# Patient Record
Sex: Female | Born: 1995
Health system: Southern US, Community
[De-identification: ages and names within clinical notes are randomized; demographics above are authoritative.]

## PROBLEM LIST (undated history)

## (undated) DIAGNOSIS — K589 Irritable bowel syndrome without diarrhea: Secondary | ICD-10-CM

## (undated) DIAGNOSIS — F32A Depression, unspecified: Secondary | ICD-10-CM

## (undated) DIAGNOSIS — F419 Anxiety disorder, unspecified: Secondary | ICD-10-CM

## (undated) DIAGNOSIS — F329 Major depressive disorder, single episode, unspecified: Secondary | ICD-10-CM

---

## 2016-08-11 ENCOUNTER — Emergency Department (HOSPITAL_COMMUNITY)
Admission: EM | Admit: 2016-08-11 | Discharge: 2016-08-11 | Disposition: A | Discharge status: Home / Self Care | Payer: Medicaid Other | Attending: Emergency Medicine | Admitting: Emergency Medicine

## 2016-08-11 ENCOUNTER — Encounter (HOSPITAL_COMMUNITY): Payer: Self-pay | Admitting: *Deleted

## 2016-08-11 DIAGNOSIS — R0789 Other chest pain: Secondary | ICD-10-CM | POA: Diagnosis not present

## 2016-08-11 DIAGNOSIS — R05 Cough: Secondary | ICD-10-CM | POA: Diagnosis present

## 2016-08-11 MED ORDER — PREDNISONE 10 MG PO TABS: 20.0000 mg | ORAL_TABLET | Freq: Every day | ORAL | 0 refills | Status: DC

## 2016-08-11 NOTE — ED Triage Notes (Signed)
Pt complains of worsening cough for the past 4 weeks. Pt states today she noticed a lump on her upper left abdomen that is painful when she coughs.

## 2016-08-11 NOTE — ED Provider Notes (Signed)
[facility] DEPT Provider Note   CSN: [id number] Arrival date & time: 08/11/16  1547     History   Chief Complaint Chief Complaint  Patient presents with  . Cough  . Abdominal Pain    HPI Madison Molina is a 20 y.o. female.  20 year old female presents with 4 week history of cough. [facility] and prescribed doxycycline as well as benzoate Perles. She had chest x-ray at that time as well which the results are pending. Cough is been nonproductive. There has been no blood associated with it. She denies any recent travel history of possible TB exposure. Denies any exposure to toxic fumes. Does admit to marijuana use. Denies any daily tobacco use. No recent weight loss. No night sweats. Cough seems to be worse at night and not associated with fever or chills. No vomiting or diarrhea noted. Patient is currently taking her current medications as prescribed. Patient developed a tender area at her left anterior rib cage characterized as sharp and worse with coughing as well as certain positions better with remaining still. Denies any worsening dyspnea.      History reviewed. No pertinent past medical history.  There are no active problems to display for this patient.   History reviewed. No pertinent surgical history.  OB History    No data available       Home Medications    Prior to Admission medications   Not on File    Family History No family history on file.  Social History Social History  Substance Use Topics  . Smoking status: Never Smoker  . Smokeless tobacco: Never Used  . Alcohol use No     Allergies   Review of patient's allergies indicates not on file.   Review of Systems Review of Systems  All other systems reviewed and are negative.    Physical Exam Updated Vital Signs BP 110/75 (BP Location: Left Arm)   Pulse 107   Temp 98.5 F (36.9 C) (Oral)   Resp 18   LMP 08/04/2016   SpO2 96%   Physical Exam    Constitutional: She is oriented to person, place, and time. She appears well-developed and well-nourished.  Non-toxic appearance. No distress.  HENT:  Head: Normocephalic and atraumatic.  Eyes: Conjunctivae, EOM and lids are normal. Pupils are equal, round, and reactive to light.  Neck: Normal range of motion. Neck supple. No tracheal deviation present. No thyroid mass present.  Cardiovascular: Normal rate, regular rhythm and normal heart sounds.  Exam reveals no gallop.   No murmur heard. Pulmonary/Chest: Effort normal and breath sounds normal. No stridor. No respiratory distress. She has no decreased breath sounds. She has no wheezes. She has no rhonchi. She has no rales. She exhibits tenderness and bony tenderness. She exhibits no crepitus.    Abdominal: Soft. Normal appearance and bowel sounds are normal. She exhibits no distension. There is no tenderness. There is no rebound and no CVA tenderness.  Musculoskeletal: Normal range of motion. She exhibits no edema or tenderness.  Neurological: She is alert and oriented to person, place, and time. She has normal strength. No cranial nerve deficit or sensory deficit. GCS eye subscore is 4. GCS verbal subscore is 5. GCS motor subscore is 6.  Skin: Skin is warm and dry. No abrasion and no rash noted.  Psychiatric: She has a normal mood and affect. Her speech is normal and behavior is normal.  Nursing note and vitals reviewed.    ED Treatments /  Results  Labs (all labs ordered are listed, but only abnormal results are displayed) Labs Reviewed - No data to display  EKG  EKG Interpretation None       Radiology No results found.  Procedures Procedures (including critical care time)  Medications Ordered in ED Medications - No data to display   Initial Impression / Assessment and Plan / ED Course  I have reviewed the triage vital signs and the nursing notes.  Pertinent labs & imaging results that were available during my care of  the patient were reviewed by me and considered in my medical decision making (see chart for details).  Clinical Course    Patient with chest wall pain. Offered x-ray which she is deferred. Pulse ox is stable. Suspect muscular skeletal etiology of her symptoms is stable for discharge with return precautions  Final Clinical Impressions(s) / ED Diagnoses   Final diagnoses:  None    New Prescriptions New Prescriptions   No medications on file     Lorre Nick, MD 08/11/16 1644

## 2016-12-03 ENCOUNTER — Encounter (HOSPITAL_COMMUNITY): Payer: Self-pay | Admitting: Emergency Medicine

## 2016-12-03 ENCOUNTER — Emergency Department (HOSPITAL_COMMUNITY)
Admission: EM | Admit: 2016-12-03 | Discharge: 2016-12-03 | Disposition: A | Discharge status: Home / Self Care | Payer: Medicaid Other | Attending: Emergency Medicine | Admitting: Emergency Medicine

## 2016-12-03 DIAGNOSIS — M791 Myalgia: Secondary | ICD-10-CM | POA: Diagnosis not present

## 2016-12-03 DIAGNOSIS — R6889 Other general symptoms and signs: Secondary | ICD-10-CM

## 2016-12-03 DIAGNOSIS — J029 Acute pharyngitis, unspecified: Secondary | ICD-10-CM | POA: Diagnosis present

## 2016-12-03 HISTORY — DX: Irritable bowel syndrome, unspecified: K58.9

## 2016-12-03 MED ORDER — ONDANSETRON 4 MG PO TBDP: 4.0000 mg | ORAL_TABLET | Freq: Three times a day (TID) | ORAL | 0 refills | Status: DC | PRN

## 2016-12-03 NOTE — ED Provider Notes (Signed)
[facility] Provider Note   CSN: [id number] Arrival date & time: 12/03/16  1016  By signing my name below, I, Modena JanskyAlbert Thayil, attest that this documentation has been prepared under the direction and in the presence of non-physician practitioner, Terance HartKelly Robbyn Hodkinson, PA-C. Electronically Signed: Modena JanskyAlbert Thayil, Scribe. 12/03/2016. 11:35 AM.  History   Chief Complaint Chief Complaint  Patient presents with  . Sore Throat  . Generalized Body Aches  . Chills   The history is provided by the patient. No language interpreter was used.   HPI Comments: Madison Molina Age is a 21 y.o. female with a PMHx of IBS who presents to the Emergency Department complaining of constant moderate sore throat that started about 4 days ago. She states she has been having gradually worsening URI-like symptoms. She has been taking Sudafed without any relief. She reports associated diaphoresis (at night), fever (subjective), chills, nasal congestion, rhinorrhea, generalized myalgias, sore throat, sneezing, coughing (productive), SOB, nausea, and headache (generalized). She admits to sick contacts. Pt's temperature in the ED today was 99.5. She denies any influenza vaccination this year, abdominal pain, vomiting, diarrhea, or other complaints.   Past Medical History:  Diagnosis Date  . IBS (irritable bowel syndrome)     There are no active problems to display for this patient.   History reviewed. No pertinent surgical history.  OB History    No data available       Home Medications    Prior to Admission medications   Medication Sig Start Date End Date Taking? Authorizing Provider  ondansetron (ZOFRAN ODT) 4 MG disintegrating tablet Take 1 tablet (4 mg total) by mouth every 8 (eight) hours as needed for nausea or vomiting. 12/03/16   Bethel BornKelly Marie Dalbert Stillings, PA-C  predniSONE (DELTASONE) 10 MG tablet Take 2 tablets (20 mg total) by mouth daily. 08/11/16   Lorre NickAnthony Allen, MD    Family History No family history on  file.  Social History Social History  Substance Use Topics  . Smoking status: Never Smoker  . Smokeless tobacco: Never Used  . Alcohol use No     Allergies   Patient has no known allergies.   Review of Systems Review of Systems  Constitutional: Positive for chills, diaphoresis and fever (Subjective).  HENT: Positive for congestion (Nasal), rhinorrhea, sneezing and sore throat.   Respiratory: Positive for cough and shortness of breath.   Gastrointestinal: Positive for nausea. Negative for abdominal pain, diarrhea and vomiting.  Musculoskeletal: Positive for myalgias (Generalized).  Neurological: Positive for headaches.     Physical Exam Updated Vital Signs BP 121/77 (BP Location: Right Arm)   Pulse 108   Temp 99.5 F (37.5 C) (Oral)   Resp 18   Ht 5\' 7"  (1.702 m)   Wt 107 lb (48.5 kg)   SpO2 100%   BMI 16.76 kg/m   Physical Exam  Constitutional: She appears well-developed and well-nourished. No distress.  Well-appearing.   HENT:  Head: Normocephalic.  Nose: Mucosal edema and rhinorrhea present.  Mouth/Throat: Mucous membranes are normal. Mucous membranes are not dry.  Eyes: Conjunctivae are normal.  Neck: Neck supple.  Cardiovascular: Regular rhythm.   Tachycardic.   Pulmonary/Chest: Effort normal.  Abdominal: Soft.  Musculoskeletal: Normal range of motion.  Neurological: She is alert.  Skin: Skin is warm and dry.  Psychiatric: She has a normal mood and affect.  Nursing note and vitals reviewed.    ED Treatments / Results  DIAGNOSTIC STUDIES: Oxygen Saturation is 100% on RA, normal by my interpretation.  COORDINATION OF CARE: 11:39 AM- Pt advised of plan for treatment and pt agrees.  Labs (all labs ordered are listed, but only abnormal results are displayed) Labs Reviewed - No data to display  EKG  EKG Interpretation None       Radiology No results found.  Procedures Procedures (including critical care time)  Medications Ordered in  ED Medications - No data to display   Initial Impression / Assessment and Plan / ED Course  I have reviewed the triage vital signs and the nursing notes.  Pertinent labs & imaging results that were available during my care of the patient were reviewed by me and considered in my medical decision making (see chart for details).  21 year old female presents with flu-like symptoms. Discussed that antibiotics are not indicated for viral infections. She is out of the window for tx with Tamiflu. Pt will be discharged with symptomatic treatment.  Verbalizes understanding and is agreeable with plan. Pt is hemodynamically stable & in NAD prior to dc.    Final Clinical Impressions(s) / ED Diagnoses   Final diagnoses:  Flu-like symptoms    New Prescriptions New Prescriptions   ONDANSETRON (ZOFRAN ODT) 4 MG DISINTEGRATING TABLET    Take 1 tablet (4 mg total) by mouth every 8 (eight) hours as needed for nausea or vomiting.   I personally performed the services described in this documentation, which was scribed in my presence. The recorded information has been reviewed and is accurate.     Bethel Born, PA-C 12/05/16 1639    Cathren Laine, MD 12/05/16 [phone]

## 2016-12-03 NOTE — Discharge Instructions (Signed)
Please rest and drink plenty of fluids Take Tylenol and Ibuprofen for pain/fever Take Zofran for nausea

## 2016-12-03 NOTE — ED Triage Notes (Signed)
Patient c/o sore throat, body aches, chills and sweats since Thursday.  Patient been taking Sudafed for her symptoms without any relief.  Patient reports that she has been sweating in her sleep.

## 2016-12-03 NOTE — ED Notes (Signed)
Bed: WTR8 Expected date:  Expected time:  Means of arrival:  Comments: No chair

## 2017-09-02 DIAGNOSIS — B081 Molluscum contagiosum: Secondary | ICD-10-CM | POA: Diagnosis not present

## 2017-09-02 DIAGNOSIS — Z113 Encounter for screening for infections with a predominantly sexual mode of transmission: Secondary | ICD-10-CM | POA: Diagnosis not present

## 2017-10-23 DIAGNOSIS — R5383 Other fatigue: Secondary | ICD-10-CM | POA: Diagnosis not present

## 2017-10-23 DIAGNOSIS — Z8719 Personal history of other diseases of the digestive system: Secondary | ICD-10-CM | POA: Diagnosis not present

## 2017-10-23 DIAGNOSIS — R110 Nausea: Secondary | ICD-10-CM | POA: Diagnosis not present

## 2017-10-23 DIAGNOSIS — F419 Anxiety disorder, unspecified: Secondary | ICD-10-CM | POA: Diagnosis not present

## 2017-11-01 DIAGNOSIS — R197 Diarrhea, unspecified: Secondary | ICD-10-CM | POA: Diagnosis not present

## 2017-11-01 DIAGNOSIS — R6881 Early satiety: Secondary | ICD-10-CM | POA: Diagnosis not present

## 2017-11-01 DIAGNOSIS — K580 Irritable bowel syndrome with diarrhea: Secondary | ICD-10-CM | POA: Diagnosis not present

## 2017-11-01 DIAGNOSIS — R634 Abnormal weight loss: Secondary | ICD-10-CM | POA: Diagnosis not present

## 2017-11-15 DIAGNOSIS — R197 Diarrhea, unspecified: Secondary | ICD-10-CM | POA: Diagnosis not present

## 2017-11-15 DIAGNOSIS — R634 Abnormal weight loss: Secondary | ICD-10-CM | POA: Diagnosis not present

## 2017-11-15 DIAGNOSIS — B9681 Helicobacter pylori [H. pylori] as the cause of diseases classified elsewhere: Secondary | ICD-10-CM | POA: Diagnosis not present

## 2017-11-15 DIAGNOSIS — K2930 Chronic superficial gastritis without bleeding: Secondary | ICD-10-CM | POA: Diagnosis not present

## 2017-11-20 DIAGNOSIS — B9681 Helicobacter pylori [H. pylori] as the cause of diseases classified elsewhere: Secondary | ICD-10-CM | POA: Diagnosis not present

## 2017-11-20 DIAGNOSIS — K2930 Chronic superficial gastritis without bleeding: Secondary | ICD-10-CM | POA: Diagnosis not present

## 2017-12-17 ENCOUNTER — Emergency Department (HOSPITAL_COMMUNITY)
Admission: EM | Admit: 2017-12-17 | Discharge: 2017-12-18 | Disposition: A | Discharge status: Home / Self Care | Payer: BLUE CROSS/BLUE SHIELD | Attending: Emergency Medicine | Admitting: Emergency Medicine

## 2017-12-17 ENCOUNTER — Other Ambulatory Visit: Payer: Self-pay

## 2017-12-17 ENCOUNTER — Encounter (HOSPITAL_COMMUNITY): Payer: Self-pay | Admitting: *Deleted

## 2017-12-17 DIAGNOSIS — Z79899 Other long term (current) drug therapy: Secondary | ICD-10-CM | POA: Insufficient documentation

## 2017-12-17 DIAGNOSIS — F329 Major depressive disorder, single episode, unspecified: Secondary | ICD-10-CM | POA: Diagnosis not present

## 2017-12-17 DIAGNOSIS — R45851 Suicidal ideations: Secondary | ICD-10-CM

## 2017-12-17 DIAGNOSIS — R9431 Abnormal electrocardiogram [ECG] [EKG]: Secondary | ICD-10-CM | POA: Diagnosis not present

## 2017-12-17 HISTORY — DX: Depression, unspecified: F32.A

## 2017-12-17 HISTORY — DX: Major depressive disorder, single episode, unspecified: F32.9

## 2017-12-17 HISTORY — DX: Anxiety disorder, unspecified: F41.9

## 2017-12-17 LAB — ETHANOL: Alcohol, Ethyl (B): <10 mg/dL (ref <10)

## 2017-12-17 LAB — COMPREHENSIVE METABOLIC PANEL
ALK PHOS: 52 U/L (ref 38–126)
ALT: 12 U/L — ABNORMAL LOW (ref 14–54)
ANION GAP: 11 (ref 5–15)
AST: 22 U/L (ref 15–41)
Albumin: 5.2 g/dL — ABNORMAL HIGH (ref 3.5–5.0)
BUN: 17 mg/dL (ref 6–20)
CALCIUM: 9.9 mg/dL (ref 8.9–10.3)
CHLORIDE: 106 mmol/L (ref 101–111)
CO2: 23 mmol/L (ref 22–32)
Creatinine, Ser: 0.73 mg/dL (ref 0.44–1.00)
GFR calc non Af Amer: >60 mL/min (ref >60)
Glucose, Bld: 96 mg/dL (ref 65–99)
POTASSIUM: 3.5 mmol/L (ref 3.5–5.1)
SODIUM: 140 mmol/L (ref 135–145)
Total Bilirubin: 1.1 mg/dL (ref 0.3–1.2)
Total Protein: 8.9 g/dL — ABNORMAL HIGH (ref 6.5–8.1)

## 2017-12-17 LAB — CBC
HCT: 42.7 % (ref 36.0–46.0)
HEMOGLOBIN: 14 g/dL (ref 12.0–15.0)
MCH: 29.5 pg (ref 26.0–34.0)
MCHC: 32.8 g/dL (ref 30.0–36.0)
MCV: 90.1 fL (ref 78.0–100.0)
Platelets: 387 10*3/uL (ref 150–400)
RBC: 4.74 MIL/uL (ref 3.87–5.11)
RDW: 14 % (ref 11.5–15.5)
WBC: 4.9 10*3/uL (ref 4.0–10.5)

## 2017-12-17 LAB — SALICYLATE LEVEL

## 2017-12-17 LAB — I-STAT BETA HCG BLOOD, ED (MC, WL, AP ONLY)

## 2017-12-17 LAB — ACETAMINOPHEN LEVEL

## 2017-12-17 MED ORDER — AMOXICILLIN 500 MG PO CAPS
1000.0000 mg | ORAL_CAPSULE | Freq: Two times a day (BID) | ORAL | Status: DC
  Administered 2017-12-17: 1000 mg via ORAL
  Filled 2017-12-17: qty 2

## 2017-12-17 MED ORDER — FAMOTIDINE 20 MG PO TABS
20.0000 mg | ORAL_TABLET | Freq: Two times a day (BID) | ORAL | Status: DC | PRN
  Filled 2017-12-17: qty 1

## 2017-12-17 MED ORDER — CLARITHROMYCIN 250 MG PO TABS
500.0000 mg | ORAL_TABLET | Freq: Two times a day (BID) | ORAL | Status: DC
  Administered 2017-12-17: 500 mg via ORAL
  Filled 2017-12-17: qty 2

## 2017-12-17 MED ORDER — HYDROXYZINE HCL 25 MG PO TABS
25.0000 mg | ORAL_TABLET | Freq: Three times a day (TID) | ORAL | Status: DC | PRN
Start: 2017-12-17 — End: 2017-12-18
  Filled 2017-12-17: qty 1
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
## 2017-12-17 NOTE — ED Notes (Signed)
Pt A&O x 3, no distress noted, cooperative and irritable, pt adamant that she does not want to be here.  Pt states she is sad all the time but manages it.  SI with plan to drive car into enclosed space.  Monitoring for safety, Q 15 min checks in effect.

## 2017-12-17 NOTE — ED Notes (Signed)
Bed: [facility] Expected date:  Expected time:  Means of arrival:  Comments: 

## 2017-12-17 NOTE — ED Notes (Signed)
Pt evaluated by TTS Vikki PortsValerie.

## 2017-12-17 NOTE — ED Triage Notes (Signed)
Pt reports feeling overwhelmed today causing her to have a panic attack resulting in cp.  She reports she has been having panic attacks more often recently.  She takes zoloft for it without relief.  She reports informing her doctor about the zoloft not helping but was told to give it time to work.  Pt is calm and cooperative.

## 2017-12-17 NOTE — ED Provider Notes (Signed)
MSE was initiated and I personally evaluated the patient and placed orders (if any) at  5:42 PM on December 17, 2017. Madison Molina is a 22 y.o.  female here with c/o panic attack, chest pain and S/I. Patient reports that exams are going on at school and she just can't take anymore. I ask patient if she felt like she wanted to hurt herself and initially she said no but when I ask if she had told the RN that she wanted to put her car in storage and commit suicide she said she did. Patient reports that when she was in Junior High she crushed up pills and took them. She reports having problems off and on since then.   BP 138/81 (BP Location: Left Arm)   Pulse 99   Temp 98.6 F (37 C) (Oral)   Resp 18   Ht 5\' 7"  (1.702 m)   Wt 44.5 kg (98 lb)   LMP 12/15/2017   SpO2 100%   BMI 15.35 kg/m   Heart regular rate and rhythm, lungs clear. Patient is tearful and appears depressed.   I discussed with the patient need for assessment and she agrees to plan.    The patient appears stable so that the remainder of the MSE may be completed by another provider.   Kerrie Buffaloeese, Hope Bon[place]M, [place] 12/17/17 [phone]

## 2017-12-17 NOTE — ED Provider Notes (Signed)
[facility]-EMERGENCY DEPT Provider Note   CSN: [id number] Arrival date & time: 12/17/17  1604     History   Chief Complaint Chief Complaint  Patient presents with  . Suicidal  . Panic Attack    HPI Madison Molina is a 22 y.o. female.  HPI Patient presented to the emergency room for evaluation of suicidal ideation.  Patient has a history of depression.  She takes Zoloft.  Patient has been having trouble with increased stress and depression.  Patient is very concerned about her exams at school.  Patient thought about driving her car into storage into an enclosed space to commit suicide.  Patient does have a history of prior suicide attempt in junior high school.  Patient does not really want to stay in the emergency room any longer.  She is concerned about missing exams. Past Medical History:  Diagnosis Date  . Anxiety   . Depression   . IBS (irritable bowel syndrome)     There are no active problems to display for this patient.   History reviewed. No pertinent surgical history.  OB History    No data available       Home Medications    Prior to Admission medications   Medication Sig Start Date End Date Taking? Authorizing Provider  amoxicillin (AMOXIL) 500 MG capsule Take 1,000 mg by mouth 2 (two) times daily.   Yes [provider]  clarithromycin (BIAXIN) 500 MG tablet Take 500 mg by mouth 2 (two) times daily.   Yes [provider]  ondansetron (ZOFRAN ODT) 4 MG disintegrating tablet Take 1 tablet (4 mg total) by mouth every 8 (eight) hours as needed for nausea or vomiting. Patient not taking: Reported on 12/17/2017 12/03/16   Bethel BornGekas, Kelly Marie, PA-C  predniSONE (DELTASONE) 10 MG tablet Take 2 tablets (20 mg total) by mouth daily. Patient not taking: Reported on 12/17/2017 08/11/16   Lorre NickAllen, Anthony, MD    Family History No family history on file.  Social History Social History   Tobacco Use  . Smoking status: Never  Smoker  . Smokeless tobacco: Never Used  Substance Use Topics  . Alcohol use: Yes  . Drug use: No     Allergies   Patient has no known allergies.   Review of Systems Review of Systems  All other systems reviewed and are negative.    Physical Exam Updated Vital Signs BP 138/81 (BP Location: Left Arm)   Pulse 99   Temp 98.6 F (37 C) (Oral)   Resp 18   Ht 1.702 m (5\' 7" )   Wt 44.5 kg (98 lb)   LMP 12/15/2017   SpO2 100%   BMI 15.35 kg/m   Physical Exam  Constitutional: She appears well-developed and well-nourished. No distress.  HENT:  Head: Normocephalic and atraumatic.  Right Ear: External ear normal.  Left Ear: External ear normal.  Eyes: Conjunctivae are normal. Right eye exhibits no discharge. Left eye exhibits no discharge. No scleral icterus.  Neck: Neck supple. No tracheal deviation present.  Cardiovascular: Normal rate, regular rhythm and intact distal pulses.  Pulmonary/Chest: Effort normal and breath sounds normal. No stridor. No respiratory distress. She has no wheezes. She has no rales.  Abdominal: Soft. Bowel sounds are normal. She exhibits no distension. There is no tenderness. There is no rebound and no guarding.  Musculoskeletal: She exhibits no edema or tenderness.  Neurological: She is alert. She has normal strength. No cranial nerve deficit (no facial droop, extraocular movements  intact, no slurred speech) or sensory deficit. She exhibits normal muscle tone. She displays no seizure activity. Coordination normal.  Skin: Skin is warm and dry. No rash noted.  Psychiatric: Her speech is not tangential. She is withdrawn. She exhibits a depressed mood. She expresses suicidal ideation.  Nursing note and vitals reviewed.    ED Treatments / Results  Labs (all labs ordered are listed, but only abnormal results are displayed) Labs Reviewed  COMPREHENSIVE METABOLIC PANEL - Abnormal; Notable for the following components:      Result Value   Total Protein  8.9 (*)    Albumin 5.2 (*)    ALT 12 (*)    All other components within normal limits  ACETAMINOPHEN LEVEL - Abnormal; Notable for the following components:   Acetaminophen (Tylenol), Serum <10 (*)    All other components within normal limits  ETHANOL  SALICYLATE LEVEL  CBC  RAPID URINE DRUG SCREEN, HOSP PERFORMED  I-STAT BETA HCG BLOOD, ED ([facility], [facility], [facility] ONLY)    EKG  EKG Interpretation  Date/Time:  Tuesday December 17 2017 16:21:50 EST Ventricular Rate:  89 PR Interval:    QRS Duration: 70 QT Interval:  360 QTC Calculation: 438 R Axis:   83 Text Interpretation:  Normal sinus rhythm with sinus arrhythmia RSR' or QR pattern in V1 suggests right ventricular conduction delay Septal infarct , age undetermined Abnormal ECG No old tracing to compare Confirmed by Linwood Dibbles [id number]) on 12/17/2017 5:54:53 PM       Radiology No results found.  Procedures Procedures (including critical care time)  Medications Ordered in ED Medications - No data to display   Initial Impression / Assessment and Plan / ED Course  I have reviewed the triage vital signs and the nursing notes.  Pertinent labs & imaging results that were available during my care of the patient were reviewed by me and considered in my medical decision making (see chart for details).  Clinical Course as of Dec 17 2029  Tue Dec 17, 2017  1810 Patient does not really want to stay.  I am concerned about her safety concerning her depression and suicidal ideation.  I explained to the patient that I will temporarily place her on IVC until we have her assessed by the psychiatry team.  [JK]    Clinical Course User Index [JK] Linwood Dibbles, MD   Labs are normal.  Pt is medically cleared for psychiatric evaluation.   Final Clinical Impressions(s) / ED Diagnoses   Final diagnoses:  Suicidal ideation    ED Discharge Orders    None       Linwood Dibbles, MD 12/17/17 2031

## 2017-12-17 NOTE — ED Notes (Signed)
Pt reports SI with plan to drive her car in a storage unit and commit suicide.  She reports it all started when her mother's boyfriend started to send her "inappropriate" text messages.  She states when she showed the text messages to her mother, she did not really do anything and remained with the boyfriend.  She is teary.  She reports her mother lost everything when she started dating this guy and now has nowhere to go home to other than her grandmother who does not support her and help her with her depression.  Medical clearance process explained to pt.  Pt verbalizes understanding.

## 2017-12-17 NOTE — ED Notes (Signed)
ED Provider at bedside. 

## 2017-12-18 NOTE — ED Provider Notes (Signed)
Patient originally seen by Dr. Lynelle DoctorKnapp.  She was IVCd and TTS evaluated the patient.  She does feel she is stable for outpatient follow-up.  I personally evaluated the patient, who denies any ongoing suicidal ideation.  Reports that she would not want to do that her family or her dog.  Reports she will return to the emergency department if she does develop to discuss that suicidal symptoms, and reports that she does want help and is comfortable following up with outpatient resources provided by TTS.  IVC rescinded. Dr. Lynelle DoctorKnapp placed discharge paperwork. Patient discharged in stable condition with understanding of reasons to return.    Madison Molina, Madison Hession, MD 12/18/17 [phone]

## 2017-12-18 NOTE — ED Notes (Addendum)
Dr Madilyn Hookees at bedside to speak with pt.  IVC rescinded.  Pending final dispo.

## 2017-12-18 NOTE — Discharge Instructions (Signed)
Follow up with the outpatient resources provided to you, return as needed for worsening symptoms

## 2018-01-03 DIAGNOSIS — Z113 Encounter for screening for infections with a predominantly sexual mode of transmission: Secondary | ICD-10-CM | POA: Diagnosis not present

## 2018-01-03 DIAGNOSIS — Z01411 Encounter for gynecological examination (general) (routine) with abnormal findings: Secondary | ICD-10-CM | POA: Diagnosis not present

## 2018-01-03 DIAGNOSIS — B373 Candidiasis of vulva and vagina: Secondary | ICD-10-CM | POA: Diagnosis not present

## 2018-07-10 DIAGNOSIS — Z113 Encounter for screening for infections with a predominantly sexual mode of transmission: Secondary | ICD-10-CM | POA: Diagnosis not present

## 2019-09-30 ENCOUNTER — Other Ambulatory Visit: Payer: Self-pay

## 2019-09-30 ENCOUNTER — Encounter (HOSPITAL_COMMUNITY): Payer: Self-pay

## 2019-09-30 ENCOUNTER — Emergency Department (HOSPITAL_COMMUNITY)
Admission: EM | Admit: 2019-09-30 | Discharge: 2019-09-30 | Disposition: A | Discharge status: Home / Self Care | Payer: BC Managed Care – PPO | Attending: Emergency Medicine | Admitting: Emergency Medicine

## 2019-09-30 ENCOUNTER — Emergency Department (HOSPITAL_COMMUNITY): Payer: BC Managed Care – PPO

## 2019-09-30 DIAGNOSIS — Z20828 Contact with and (suspected) exposure to other viral communicable diseases: Secondary | ICD-10-CM | POA: Diagnosis not present

## 2019-09-30 DIAGNOSIS — Z79899 Other long term (current) drug therapy: Secondary | ICD-10-CM | POA: Insufficient documentation

## 2019-09-30 DIAGNOSIS — R55 Syncope and collapse: Secondary | ICD-10-CM | POA: Insufficient documentation

## 2019-09-30 DIAGNOSIS — E876 Hypokalemia: Secondary | ICD-10-CM | POA: Diagnosis not present

## 2019-09-30 LAB — URINALYSIS, ROUTINE W REFLEX MICROSCOPIC
Bacteria, UA: NONE SEEN
Bilirubin Urine: NEGATIVE
Glucose, UA: NEGATIVE mg/dL
Ketones, ur: 80 mg/dL — AB
Leukocytes,Ua: NEGATIVE
Nitrite: NEGATIVE
Protein, ur: NEGATIVE mg/dL
Specific Gravity, Urine: 1.01 (ref 1.005–1.030)
pH: 7 (ref 5.0–8.0)

## 2019-09-30 LAB — DIFFERENTIAL
Abs Immature Granulocytes: 0.01 10*3/uL (ref 0.00–0.07)
Basophils Absolute: 0 10*3/uL (ref 0.0–0.1)
Basophils Relative: 0 %
Eosinophils Absolute: 0 10*3/uL (ref 0.0–0.5)
Eosinophils Relative: 1 %
Immature Granulocytes: 0 %
Lymphocytes Relative: 21 %
Lymphs Abs: 1.5 10*3/uL (ref 0.7–4.0)
Monocytes Absolute: 0.6 10*3/uL (ref 0.1–1.0)
Monocytes Relative: 8 %
Neutro Abs: 4.7 10*3/uL (ref 1.7–7.7)
Neutrophils Relative %: 70 %

## 2019-09-30 LAB — I-STAT BETA HCG BLOOD, ED (MC, WL, AP ONLY): I-stat hCG, quantitative: <5 m[IU]/mL (ref <5)

## 2019-09-30 LAB — CBC
HCT: 37.9 % (ref 36.0–46.0)
Hemoglobin: 12.6 g/dL (ref 12.0–15.0)
MCH: 28.9 pg (ref 26.0–34.0)
MCHC: 33.2 g/dL (ref 30.0–36.0)
MCV: 86.9 fL (ref 80.0–100.0)
Platelets: 302 10*3/uL (ref 150–400)
RBC: 4.36 MIL/uL (ref 3.87–5.11)
RDW: 13.2 % (ref 11.5–15.5)
WBC: 6.8 10*3/uL (ref 4.0–10.5)
nRBC: 0 % (ref 0.0–0.2)

## 2019-09-30 LAB — RAPID URINE DRUG SCREEN, HOSP PERFORMED
Amphetamines: NOT DETECTED
Barbiturates: NOT DETECTED
Benzodiazepines: NOT DETECTED
Cocaine: POSITIVE — AB
Opiates: NOT DETECTED
Tetrahydrocannabinol: POSITIVE — AB

## 2019-09-30 LAB — ETHANOL: Alcohol, Ethyl (B): <10 mg/dL (ref <10)

## 2019-09-30 LAB — COMPREHENSIVE METABOLIC PANEL
ALT: 15 U/L (ref 0–44)
AST: 19 U/L (ref 15–41)
Albumin: 4.3 g/dL (ref 3.5–5.0)
Alkaline Phosphatase: 46 U/L (ref 38–126)
Anion gap: 12 (ref 5–15)
BUN: 8 mg/dL (ref 6–20)
CO2: 22 mmol/L (ref 22–32)
Calcium: 9.5 mg/dL (ref 8.9–10.3)
Chloride: 107 mmol/L (ref 98–111)
Creatinine, Ser: 0.72 mg/dL (ref 0.44–1.00)
GFR calc Af Amer: >60 mL/min (ref >60)
GFR calc non Af Amer: >60 mL/min (ref >60)
Glucose, Bld: 98 mg/dL (ref 70–99)
Potassium: 3 mmol/L — ABNORMAL LOW (ref 3.5–5.1)
Sodium: 141 mmol/L (ref 135–145)
Total Bilirubin: 0.8 mg/dL (ref 0.3–1.2)
Total Protein: 7.5 g/dL (ref 6.5–8.1)

## 2019-09-30 MED ORDER — SODIUM CHLORIDE 0.9 % IV BOLUS
1000.0000 mL | Freq: Once | INTRAVENOUS | Status: AC
  Administered 2019-09-30: 22:00:00 1000 mL via INTRAVENOUS

## 2019-09-30 MED ORDER — SODIUM CHLORIDE 0.9% FLUSH: 3.0000 mL | Freq: Once | INTRAVENOUS | Status: DC

## 2019-09-30 MED ORDER — POTASSIUM CHLORIDE CRYS ER 20 MEQ PO TBCR: 20.0000 meq | EXTENDED_RELEASE_TABLET | Freq: Every day | ORAL | 0 refills | Status: AC

## 2019-09-30 MED ORDER — IBUPROFEN 400 MG PO TABS
600.0000 mg | ORAL_TABLET | Freq: Once | ORAL | Status: AC
  Administered 2019-09-30: 600 mg via ORAL
  Filled 2019-09-30: qty 1

## 2019-09-30 NOTE — ED Notes (Signed)
Patient grandmother Reeves Forth [phone] calling stating her and patient are driving up here from [place] but would like a call to get an update on patient

## 2019-09-30 NOTE — ED Triage Notes (Signed)
To hallway bed via EMS.  Pt was found unconscious in the floor at [facility], not witnessed.  Mouth bleeding.  Pt in "post ictal state" when EMS arrived.  Pt doesn't remember being at [facility] or passing out.  C/o headache 5/10 on pain scale and right elbow pain 8/10 on pain scale.   A&O x 4 en route to ED.  EMS BP 124/70 HR 90 SpO2 98%  CBG 139

## 2019-09-30 NOTE — ED Provider Notes (Signed)
[facility] EMERGENCY DEPARTMENT Provider Note   CSN: [id number] Arrival date & time: 09/30/19  1851     History   Chief Complaint Chief Complaint  Patient presents with  . Loss of Consciousness    HPI Madison Molina is a 23 y.o. female.     HPI   Madison Molina is a 23 y.o. female, with a history of anxiety and depression, presenting to the ED with possible seizure versus syncope. Patient states she remembers going into [employer] and looking in the produce aisle. She was reportedly found unconscious on the floor.  EMS reports patient seemed postictal and initially confused, but became oriented after several minutes. CBG 139. Patient notes she has some pain in a small wound to the tip of her tongue.  Denies any previous seizure history.  Denies regular alcohol use.  Denies illicit drug use.  Denies fever/chills, recent illness, shortness of breath, chest pain, abdominal pain, incontinence, neurologic deficits, neck/back pain, headache, or any other complaints.  Past Medical History:  Diagnosis Date  . Anxiety   . Depression   . IBS (irritable bowel syndrome)     There are no active problems to display for this patient.   History reviewed. No pertinent surgical history.   OB History   No obstetric history on file.      Home Medications    Prior to Admission medications   Medication Sig Start Date End Date Taking? Authorizing Provider  amoxicillin (AMOXIL) 500 MG capsule Take 1,000 mg by mouth 2 (two) times daily.    [provider]  clarithromycin (BIAXIN) 500 MG tablet Take 500 mg by mouth 2 (two) times daily.    [provider]  ondansetron (ZOFRAN ODT) 4 MG disintegrating tablet Take 1 tablet (4 mg total) by mouth every 8 (eight) hours as needed for nausea or vomiting. Patient not taking: Reported on 12/17/2017 12/03/16   Bethel Born, PA-C  potassium chloride SA (KLOR-CON) 20 MEQ tablet Take 1 tablet (20 mEq  total) by mouth daily for 5 days. 09/30/19 10/05/19  Whitt Auletta C, PA-C  predniSONE (DELTASONE) 10 MG tablet Take 2 tablets (20 mg total) by mouth daily. Patient not taking: Reported on 12/17/2017 08/11/16   Lorre Nick, MD    Family History History reviewed. No pertinent family history.  Social History Social History   Tobacco Use  . Smoking status: Never Smoker  . Smokeless tobacco: Never Used  Substance Use Topics  . Alcohol use: Yes  . Drug use: No     Allergies   Patient has no known allergies.   Review of Systems Review of Systems  Constitutional: Negative for chills, diaphoresis and fever.  Respiratory: Negative for cough and shortness of breath.   Cardiovascular: Negative for chest pain.  Gastrointestinal: Negative for abdominal pain, diarrhea, nausea and vomiting.  Genitourinary: Negative for dysuria.  Musculoskeletal: Negative for back pain and neck pain.  Neurological: Positive for seizures and syncope. Negative for dizziness, weakness, light-headedness and headaches.  All other systems reviewed and are negative.    Physical Exam Updated Vital Signs BP 128/67 (BP Location: Right Arm)   Pulse 95   Temp 98.6 F (37 C) (Oral)   Resp 17   SpO2 100%   Physical Exam Vitals signs and nursing note reviewed.  Constitutional:      General: She is not in acute distress.    Appearance: She is well-developed. She is not diaphoretic.  HENT:  Head: Normocephalic and atraumatic.     Mouth/Throat:     Mouth: Mucous membranes are moist.     Pharynx: Oropharynx is clear.     Comments: Small, mild abrasion to the tip of the tongue.  No noted tongue swelling or other injuries noted. Eyes:     Conjunctiva/sclera: Conjunctivae normal.  Neck:     Musculoskeletal: Neck supple.  Cardiovascular:     Rate and Rhythm: Normal rate and regular rhythm.     Pulses: Normal pulses.          Radial pulses are 2+ on the right side and 2+ on the left side.       Posterior  tibial pulses are 2+ on the right side and 2+ on the left side.     Heart sounds: Normal heart sounds.     Comments: Tactile temperature in the extremities appropriate and equal bilaterally. Pulmonary:     Effort: Pulmonary effort is normal. No respiratory distress.     Breath sounds: Normal breath sounds.  Abdominal:     Palpations: Abdomen is soft.     Tenderness: There is no abdominal tenderness. There is no guarding.  Genitourinary:    Comments: No noted evidence of incontinence. Musculoskeletal:     Right lower leg: No edema.     Left lower leg: No edema.  Lymphadenopathy:     Cervical: No cervical adenopathy.  Skin:    General: Skin is warm and dry.  Neurological:     Mental Status: She is alert and oriented to person, place, and time.     Comments: Sensation grossly intact to light touch in the extremities.  Grip strengths equal bilaterally.  Strength 5/5 in all extremities. No gait disturbance. Coordination intact. Cranial nerves III-XII grossly intact. No facial droop.   Psychiatric:        Mood and Affect: Mood and affect normal.        Speech: Speech normal.        Behavior: Behavior normal.      ED Treatments / Results  Labs (all labs ordered are listed, but only abnormal results are displayed) Labs Reviewed  URINALYSIS, ROUTINE W REFLEX MICROSCOPIC - Abnormal; Notable for the following components:      Result Value   Color, Urine STRAW (*)    Hgb urine dipstick LARGE (*)    Ketones, ur 80 (*)    All other components within normal limits  COMPREHENSIVE METABOLIC PANEL - Abnormal; Notable for the following components:   Potassium 3.0 (*)    All other components within normal limits  RAPID URINE DRUG SCREEN, HOSP PERFORMED - Abnormal; Notable for the following components:   Cocaine POSITIVE (*)    Tetrahydrocannabinol POSITIVE (*)    All other components within normal limits  SARS CORONAVIRUS 2 (TAT 6-24 HRS)  CBC  ETHANOL  DIFFERENTIAL  CBG MONITORING, ED   I-STAT BETA HCG BLOOD, ED ([facility], [facility], [facility] ONLY)    EKG EKG Interpretation  Date/Time:  Wednesday September 30 2019 18:53:18 EST Ventricular Rate:  84 PR Interval:  112 QRS Duration: 80 QT Interval:  376 QTC Calculation: 444 R Axis:   65 Text Interpretation: Sinus rhythm with marked sinus arrhythmia Otherwise normal ECG similar to Feb 2019 Confirmed by Pricilla LovelessGoldston, Scott [phone]([id number]) on 09/30/2019 7:03:50 PM   Radiology Ct Head Wo Contrast  Result Date: 09/30/2019 CLINICAL DATA:  Found unconscious at [employer], mouth bleeding, amnestic to event EXAM: CT HEAD WITHOUT CONTRAST TECHNIQUE: Contiguous axial images  were obtained from the base of the skull through the vertex without intravenous contrast. COMPARISON:  None. FINDINGS: Brain: No evidence of acute infarction, hemorrhage, hydrocephalus, extra-axial collection or mass lesion/mass effect. No developmental structural abnormalities are clearly evident. Vascular: No hyperdense vessel or unexpected calcification. Skull: No calvarial fracture or suspicious osseous lesion. No scalp swelling or hematoma. Sinuses/Orbits: Scant pneumatized secretions in the right sphenoid sinus. Remaining paranasal sinuses are predominantly clear. Included orbital structures are unremarkable. Other: None IMPRESSION: Unremarkable CT appearance of the brain. Mild pneumatized secretions in the right sphenoid, could correlate for features of sinusitis. Electronically Signed   By: Lovena Le M.D.   On: 09/30/2019 22:19    Procedures Procedures (including critical care time)  Medications Ordered in ED Medications  sodium chloride flush (NS) 0.9 % injection 3 mL (has no administration in time range)  sodium chloride 0.9 % bolus 1,000 mL (0 mLs Intravenous Stopped 09/30/19 2224)  ibuprofen (ADVIL) tablet 600 mg (600 mg Oral Given 09/30/19 2224)     Initial Impression / Assessment and Plan / ED Course  I have reviewed the triage vital signs and the nursing notes.  Pertinent  labs & imaging results that were available during my care of the patient were reviewed by me and considered in my medical decision making (see chart for details).  Clinical Course as of Sep 29 2310  Wed Sep 30, 2019  2250 Patient is on her menstrual cycle.  Hgb urine dipstick(!): LARGE [SJ]    Clinical Course User Index [SJ] Kelechi Orgeron C, PA-C       Patient presents following an episode of syncope versus seizure.  She is at her neurologic baseline upon presentation to the ED.   Patient is nontoxic appearing, afebrile, not tachycardic, not tachypneic, not hypotensive, maintains excellent SPO2 on room air, and is in no apparent distress.  No seizure activity witnessed during her ED course. Mild hypokalemia noted.  Addressed with supplementation.  Ketonuria thought to be due to some dehydration.  This was addressed with IV and oral fluids. CT of the head without acute abnormality. Testing for COVID-19 pending. Patient placed on seizure precautions with explanation to the patient. Patient and her mother at the bedside were given instructions for home care as well as return precautions.  Both parties voice understanding of these instructions, accept the plan, and are comfortable with discharge.  Findings and plan of care discussed with Sherwood Gambler, MD. Dr. Regenia Skeeter personally evaluated and examined this patient.  Vitals:   09/30/19 1850 09/30/19 2230  BP: 128/67 120/70  Pulse: 95 71  Resp: 17 16  Temp: 98.6 F (37 C)   TempSrc: Oral   SpO2: 100% 100%     Final Clinical Impressions(s) / ED Diagnoses   Final diagnoses:  Syncope and collapse  Hypokalemia    ED Discharge Orders         Ordered    Ambulatory referral to Neurology    Comments: An appointment is requested in approximately: 2 weeks   09/30/19 2236    potassium chloride SA (KLOR-CON) 20 MEQ tablet  Daily     09/30/19 2237           Lorayne Bender, PA-C 09/30/19 2311    Sherwood Gambler, MD 10/03/19 [phone]

## 2019-09-30 NOTE — Discharge Instructions (Addendum)
You may have had a seizure or a syncopal event today.  Work-up today is reassuring, however, you will need to follow-up with a neurologist on this matter.  As an unrelated matter, you were noted to have low potassium.  This can be addressed with diet changes and supplementation.  This value should be retested through a primary care provider.  Per [facility] statutes, patients with seizures are not allowed to drive until  they have been seizure-free for six months. Use caution when using heavy equipment or power tools. Avoid working on ladders or at heights. Take showers instead of baths. Ensure the water temperature is not too high on the home water heater. Do not go swimming alone. When caring for infants or small children, sit down when holding, feeding, or changing them to minimize risk of injury to the child in the event you have a seizure.   Also, Maintain good sleep hygiene. Avoid alcohol.   --> Call 911 and bring the patient back to the ED if:                   A.  The seizure lasts longer than 5 minutes.                  B.  The patient doesn't awaken shortly after the seizure             C.  The patient has new problems such as difficulty seeing, speaking or moving             D.  The patient was injured during the seizure             E.  The patient has a temperature over 102 F (39C)             F.  The patient vomited and now is having trouble breathing   For prescription assistance, may try using prescription discount sites or apps, such as goodrx.com  Test Results for COVID-19 pending  You have a test pending for COVID-19.  Results typically return within about 48 hours.  Be sure to check MyChart for updated results.  We recommend isolating yourself until results are received.  Patients who have symptoms consistent with COVID-19 should self isolated for: At least 3 days (72 hours) have passed since recovery, defined as resolution of fever without the use of fever  reducing medications and improvement in respiratory symptoms (e.g., cough, shortness of breath), and At least 7 days have passed since symptoms first appeared.  If you have no symptoms, but your test returns positive, recommend isolating for at least 10 days.

## 2019-10-01 LAB — SARS CORONAVIRUS 2 (TAT 6-24 HRS): SARS Coronavirus 2: NEGATIVE

## 2019-10-01 LAB — CBG MONITORING, ED: Glucose-Capillary: 118 mg/dL — ABNORMAL HIGH (ref 70–99)

## 2019-10-02 ENCOUNTER — Telehealth (HOSPITAL_COMMUNITY): Payer: Self-pay

## 2019-10-06 ENCOUNTER — Ambulatory Visit: Payer: Self-pay | Admitting: Neurology

## 2020-10-08 ENCOUNTER — Emergency Department (HOSPITAL_COMMUNITY)
Admission: EM | Admit: 2020-10-08 | Discharge: 2020-10-08 | Disposition: A | Discharge status: Home / Self Care | Payer: BC Managed Care – PPO | Attending: Emergency Medicine | Admitting: Emergency Medicine

## 2020-10-08 ENCOUNTER — Other Ambulatory Visit: Payer: Self-pay

## 2020-10-08 ENCOUNTER — Encounter (HOSPITAL_COMMUNITY): Payer: Self-pay | Admitting: Emergency Medicine

## 2020-10-08 DIAGNOSIS — Z5321 Procedure and treatment not carried out due to patient leaving prior to being seen by health care provider: Secondary | ICD-10-CM | POA: Insufficient documentation

## 2020-10-08 DIAGNOSIS — R0602 Shortness of breath: Secondary | ICD-10-CM | POA: Diagnosis not present

## 2020-10-08 DIAGNOSIS — R079 Chest pain, unspecified: Secondary | ICD-10-CM | POA: Insufficient documentation

## 2020-10-08 NOTE — ED Triage Notes (Signed)
Pt states that she started having chest  Pain and SOB last night at 3am that comes and goes. Alert and oriented.

## 2021-08-09 ENCOUNTER — Ambulatory Visit (HOSPITAL_BASED_OUTPATIENT_CLINIC_OR_DEPARTMENT_OTHER)
Admission: RE | Admit: 2021-08-09 | Discharge: 2021-08-09 | Disposition: A | Discharge status: Home / Self Care | Payer: BC Managed Care – PPO | Source: Ambulatory Visit | Attending: Emergency Medicine | Admitting: Emergency Medicine

## 2021-08-09 ENCOUNTER — Ambulatory Visit
Admission: EM | Admit: 2021-08-09 | Discharge: 2021-08-09 | Disposition: A | Discharge status: Home / Self Care | Payer: BC Managed Care – PPO | Attending: Emergency Medicine | Admitting: Emergency Medicine

## 2021-08-09 ENCOUNTER — Other Ambulatory Visit: Payer: Self-pay

## 2021-08-09 DIAGNOSIS — S99922A Unspecified injury of left foot, initial encounter: Secondary | ICD-10-CM | POA: Diagnosis not present

## 2021-08-09 DIAGNOSIS — M79672 Pain in left foot: Secondary | ICD-10-CM | POA: Diagnosis not present

## 2021-08-09 DIAGNOSIS — W19XXXA Unspecified fall, initial encounter: Secondary | ICD-10-CM | POA: Insufficient documentation

## 2021-08-09 MED ORDER — IBUPROFEN 800 MG PO TABS: 800.0000 mg | ORAL_TABLET | Freq: Three times a day (TID) | ORAL | 0 refills | Status: AC

## 2021-08-09 NOTE — ED Provider Notes (Signed)
[facility]    CSN: [id number] Arrival date & time: 08/09/21  0802      History   Chief Complaint Chief Complaint  Patient presents with   Foot Pain    HPI Madison Molina is a 25 y.o. female.   Pt states last night while walking down the stairs with her dog on a leash, her dog threw her off balance, she slipped and fell down the stairs landing on the top part of her left foot.  Patient states she immediately had pain after the injury, wrapped it in a makeshift cloth wrap, did not have any improvement overnight so decided to come have it evaluated today.  Patient denies prior injury to left foot.  Patient states she can ambulate but has significant pain when doing so.  Patient states she is not taking any anti-inflammatory medication at this time.  The history is provided by the patient.   Past Medical History:  Diagnosis Date   Anxiety    Depression    IBS (irritable bowel syndrome)     There are no problems to display for this patient.   History reviewed. No pertinent surgical history.  OB History   No obstetric history on file.      Home Medications    Prior to Admission medications   Medication Sig Start Date End Date Taking? Authorizing Provider  ibuprofen (ADVIL) 800 MG tablet Take 1 tablet (800 mg total) by mouth 3 (three) times daily for 14 days. 08/09/21 08/23/21 Yes Theadora Rama Scales, PA-C  potassium chloride SA (KLOR-CON) 20 MEQ tablet Take 1 tablet (20 mEq total) by mouth daily for 5 days. 09/30/19 10/05/19  Joy, Shawn C, PA-C  traMADol (ULTRAM) 50 MG tablet Take 1 tablet (50 mg total) by mouth every 6 (six) hours as needed for up to 3 days. 08/10/21 08/13/21  Theadora Rama Scales, PA-C    Family History History reviewed. No pertinent family history.  Social History Social History   Tobacco Use   Smoking status: Never   Smokeless tobacco: Never  Substance Use Topics   Alcohol use: Yes   Drug use: No     Allergies    Patient has no known allergies.   Review of Systems Review of Systems Pertinent findings noted in history of present illness.    Physical Exam Triage Vital Signs ED Triage Vitals  Enc Vitals Group     BP      Pulse      Resp      Temp      Temp src      SpO2      Weight      Height      Head Circumference      Peak Flow      Pain Score      Pain Loc      Pain Edu?      Excl. in GC?    No data found.  Updated Vital Signs BP 111/71 (BP Location: Right Arm)   Pulse 98   Temp 97.8 F (36.6 C) (Oral)   Resp 18   Wt 140 lb (63.5 kg)   LMP 07/29/2021   SpO2 97%   BMI 21.93 kg/m   Visual Acuity Right Eye Distance:   Left Eye Distance:   Bilateral Distance:    Right Eye Near:   Left Eye Near:    Bilateral Near:     Physical Exam Vitals and nursing note reviewed.  Constitutional:  Appearance: Normal appearance.  HENT:     Head: Normocephalic and atraumatic.  Eyes:     Conjunctiva/sclera: Conjunctivae normal.  Cardiovascular:     Rate and Rhythm: Normal rate and regular rhythm.     Pulses: Normal pulses.     Heart sounds: Normal heart sounds.  Pulmonary:     Effort: Pulmonary effort is normal.     Breath sounds: Normal breath sounds.  Abdominal:     General: Abdomen is flat. Bowel sounds are normal.     Palpations: Abdomen is soft.  Musculoskeletal:        General: Swelling, tenderness, deformity and signs of injury present. Normal range of motion.     Cervical back: Normal range of motion and neck supple.  Skin:    General: Skin is warm and dry.  Neurological:     General: No focal deficit present.     Mental Status: She is alert and oriented to person, place, and time. Mental status is at baseline.  Psychiatric:        Mood and Affect: Mood normal.        Behavior: Behavior normal.     UC Treatments / Results  Labs (all labs ordered are listed, but only abnormal results are displayed) Labs Reviewed - No data to  display  EKG   Radiology DG Foot Complete Left  Result Date: 08/09/2021 CLINICAL DATA:  Pain post fall EXAM: LEFT FOOT - COMPLETE 3+ VIEW COMPARISON:  None. FINDINGS: There is no evidence of fracture or dislocation. There is no evidence of arthropathy or other focal bone abnormality. Soft tissues are unremarkable. IMPRESSION: Negative. Electronically Signed   By: Corlis Leak M.D.   On: 08/09/2021 09:54    Procedures Procedures (including critical care time)  Medications Ordered in UC Medications - No data to display  Initial Impression / Assessment and Plan / UC Course  I have reviewed the triage vital signs and the nursing notes.  Pertinent labs & imaging results that were available during my care of the patient were reviewed by me and considered in my medical decision making (see chart for details).     Patient was placed in a cam boot and advised to go to the imaging center to have an x-ray done.  Patient was provided with ibuprofen 800 mg for pain, recommend nonweightbearing as much as possible, keep foot elevated as much as possible.  Further instructions to be provided once x-ray results are available.  Patient verbalized understanding and agreement of plan as discussed.  All questions were addressed during visit.  Please see discharge instructions below for further details of plan.  Final Clinical Impressions(s) / UC Diagnoses   Final diagnoses:  Foot injury, left, initial encounter     Discharge Instructions      Based on history provided as well as my physical exam findings, I am concerned that your left foot may be broken.  We have placed you in a boot called a cam boot, this will immobilize your foot and keep the bones from moving while you are trying to get around.  Please go to [facility] emergency room to have your x-rays done as soon as possible.  Based on those results, you will be advised to go to orthopedics if it is broken or  follow-up with primary care if it  is not.  I see that you do not have a primary care provider listed in your chart, I have initiated a request to help you  find one.  You with a prescription for ibuprofen for your pain.  Please keep your foot elevated when you are not needing to get around, you can apply ice as tolerated to help with swelling.     ED Prescriptions     Medication Sig Dispense Auth. Provider   ibuprofen (ADVIL) 800 MG tablet Take 1 tablet (800 mg total) by mouth 3 (three) times daily for 14 days. 42 tablet Theadora Rama Scales, PA-C      PDMP not reviewed this encounter.   Theadora Rama Scales, [place] 08/10/21 [phone]

## 2021-08-09 NOTE — Discharge Instructions (Addendum)
Based on history provided as well as my physical exam findings, I am concerned that your left foot may be broken.  We have placed you in a boot called a cam boot, this will immobilize your foot and keep the bones from moving while you are trying to get around.  Please go to [facility] emergency room to have your x-rays done as soon as possible.  Based on those results, you will be advised to go to orthopedics if it is broken or  follow-up with primary care if it is not.  I see that you do not have a primary care provider listed in your chart, I have initiated a request to help you find one.  You with a prescription for ibuprofen for your pain.  Please keep your foot elevated when you are not needing to get around, you can apply ice as tolerated to help with swelling.

## 2021-08-09 NOTE — ED Triage Notes (Signed)
Pt states last night while walking down the stairs with her dog she slipped and fell down the stairs. Today she woke up with left foot pain.  She has the area wrapped with ace bandage and a sock.

## 2021-08-10 ENCOUNTER — Telehealth: Payer: Self-pay | Admitting: Emergency Medicine

## 2021-08-10 MED ORDER — TRAMADOL HCL 50 MG PO TABS: 50.0000 mg | ORAL_TABLET | Freq: Four times a day (QID) | ORAL | 0 refills | Status: AC | PRN

## 2021-08-10 NOTE — Telephone Encounter (Signed)
Patient suffered a significant fall on the stairs while walking her dog, fell on foot, foot was visibly deformed on exam however there was no evidence of fracture on x-ray.  Patient is in a considerable amount of pain, states that ibuprofen 800 mg is not giving her enough relief.  Patient is requesting something stronger.  I will provide patient with a 3-day supply of tramadol, 50 mg to be taken 4 times daily as needed for pain.  Patient advised will be no refills of this medication.

## 2023-01-20 IMAGING — DX DG FOOT COMPLETE 3+V*L*
4 series · 4 of 4 positions shown · non-contrast
Comparison: None.

CLINICAL DATA: Pain post fall

EXAM:
LEFT FOOT - COMPLETE 3+ VIEW

[foot ap]
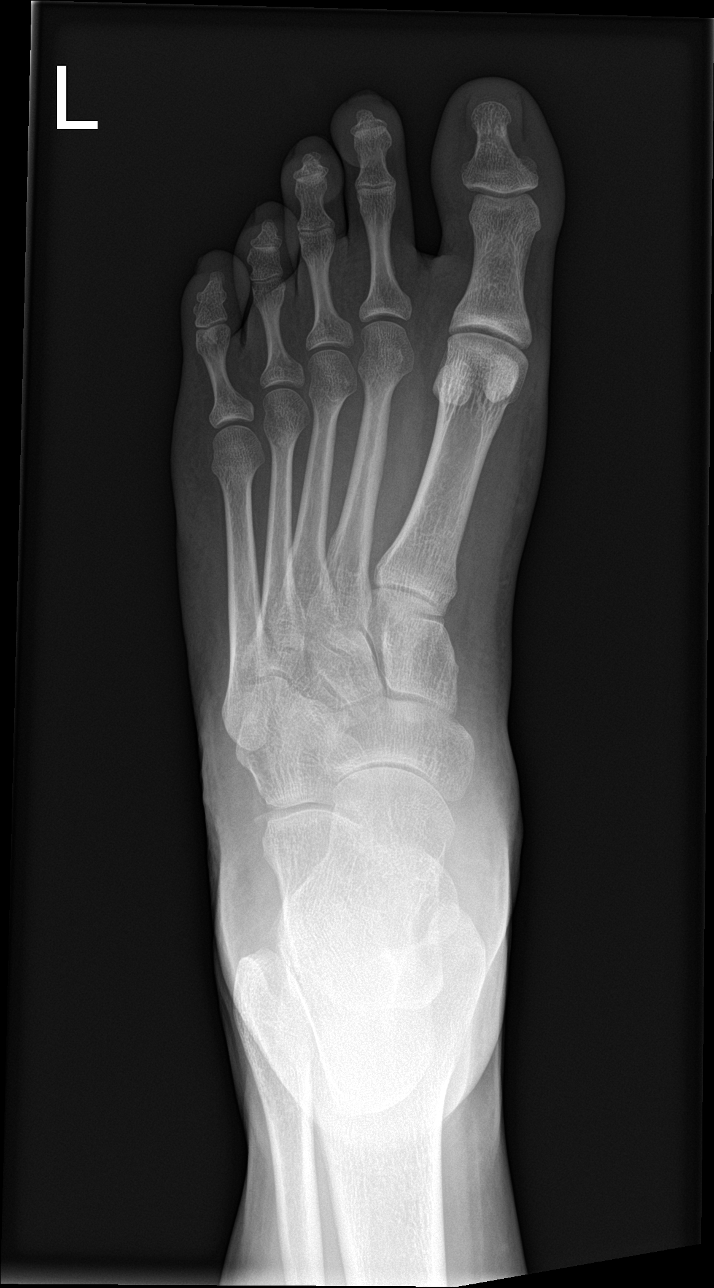

[foot obl]
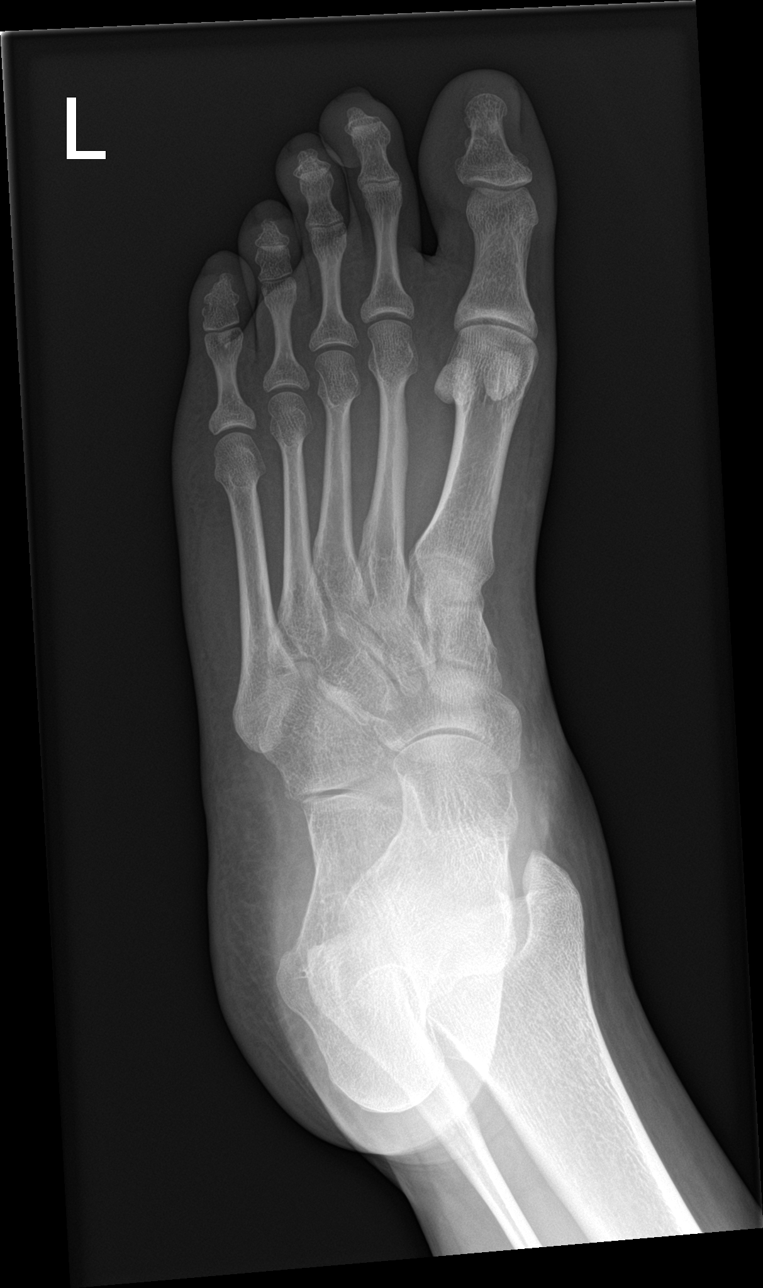

[foot lat (1 of 2)]
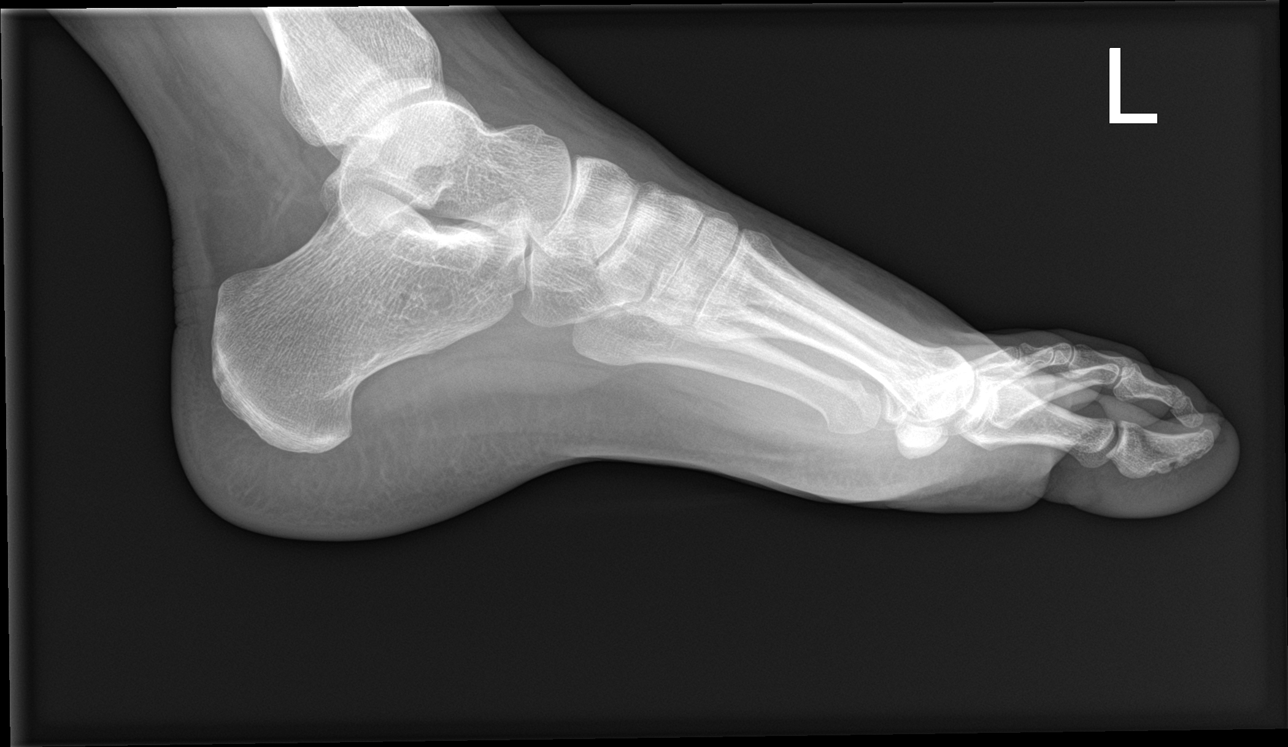

[foot lat (2 of 2)]
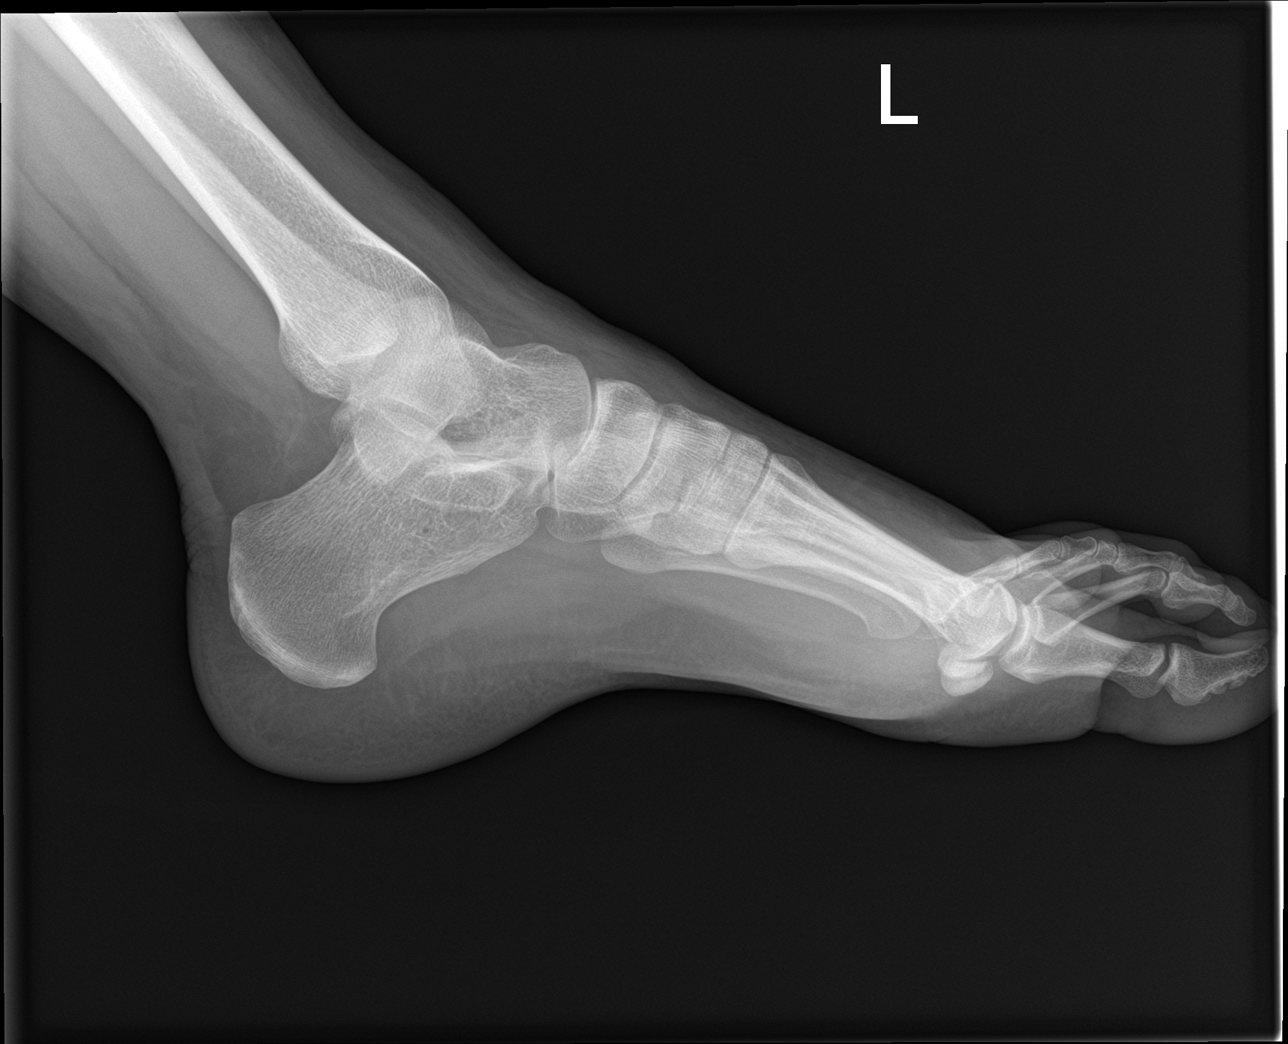

[4 of 4 positions shown; findings below may reference images not displayed]

FINDINGS: There is no evidence of fracture or dislocation. There is no
evidence of arthropathy or other focal bone abnormality. Soft
tissues are unremarkable.
IMPRESSION: Negative.
